# Patient Record
Sex: Male | Born: 2000 | Race: White | Hispanic: No | Marital: Single | Smoking: Never smoker
Health system: Southern US, Community
[De-identification: ages and names within clinical notes are randomized; demographics above are authoritative.]

## PROBLEM LIST (undated history)

## (undated) HISTORY — PX: OTHER SURGICAL HISTORY: SHX169

---
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
## 2018-04-06 DIAGNOSIS — Z23 Encounter for immunization: Secondary | ICD-10-CM | POA: Diagnosis not present

## 2018-04-30 DIAGNOSIS — M9903 Segmental and somatic dysfunction of lumbar region: Secondary | ICD-10-CM | POA: Diagnosis not present

## 2018-05-04 DIAGNOSIS — M9903 Segmental and somatic dysfunction of lumbar region: Secondary | ICD-10-CM | POA: Diagnosis not present

## 2018-05-13 DIAGNOSIS — M9903 Segmental and somatic dysfunction of lumbar region: Secondary | ICD-10-CM | POA: Diagnosis not present

## 2018-05-27 DIAGNOSIS — M9903 Segmental and somatic dysfunction of lumbar region: Secondary | ICD-10-CM | POA: Diagnosis not present

## 2018-06-11 DIAGNOSIS — Z68.41 Body mass index (BMI) pediatric, 5th percentile to less than 85th percentile for age: Secondary | ICD-10-CM | POA: Diagnosis not present

## 2018-06-11 DIAGNOSIS — Z713 Dietary counseling and surveillance: Secondary | ICD-10-CM | POA: Diagnosis not present

## 2018-06-11 DIAGNOSIS — Z0000 Encounter for general adult medical examination without abnormal findings: Secondary | ICD-10-CM | POA: Diagnosis not present

## 2018-06-11 DIAGNOSIS — Z23 Encounter for immunization: Secondary | ICD-10-CM | POA: Diagnosis not present

## 2018-06-11 DIAGNOSIS — Z7182 Exercise counseling: Secondary | ICD-10-CM | POA: Diagnosis not present

## 2018-06-30 DIAGNOSIS — H5213 Myopia, bilateral: Secondary | ICD-10-CM | POA: Diagnosis not present

## 2019-01-08 ENCOUNTER — Ambulatory Visit
Admission: RE | Admit: 2019-01-08 | Discharge: 2019-01-08 | Disposition: A | Discharge status: Home / Self Care | Payer: No Typology Code available for payment source | Source: Ambulatory Visit | Attending: Nurse Practitioner | Admitting: Nurse Practitioner

## 2019-01-08 ENCOUNTER — Other Ambulatory Visit: Payer: Self-pay

## 2019-01-08 ENCOUNTER — Other Ambulatory Visit: Payer: Self-pay | Admitting: Nurse Practitioner

## 2019-01-08 DIAGNOSIS — Z021 Encounter for pre-employment examination: Secondary | ICD-10-CM

## 2020-10-05 IMAGING — CR CHEST  1 VIEW
1 series · 1 of 1 positions shown · non-contrast
Comparison: None.

CLINICAL DATA: Pre-employment physical examination

EXAM:
CHEST  1 VIEW

[w chest pa]
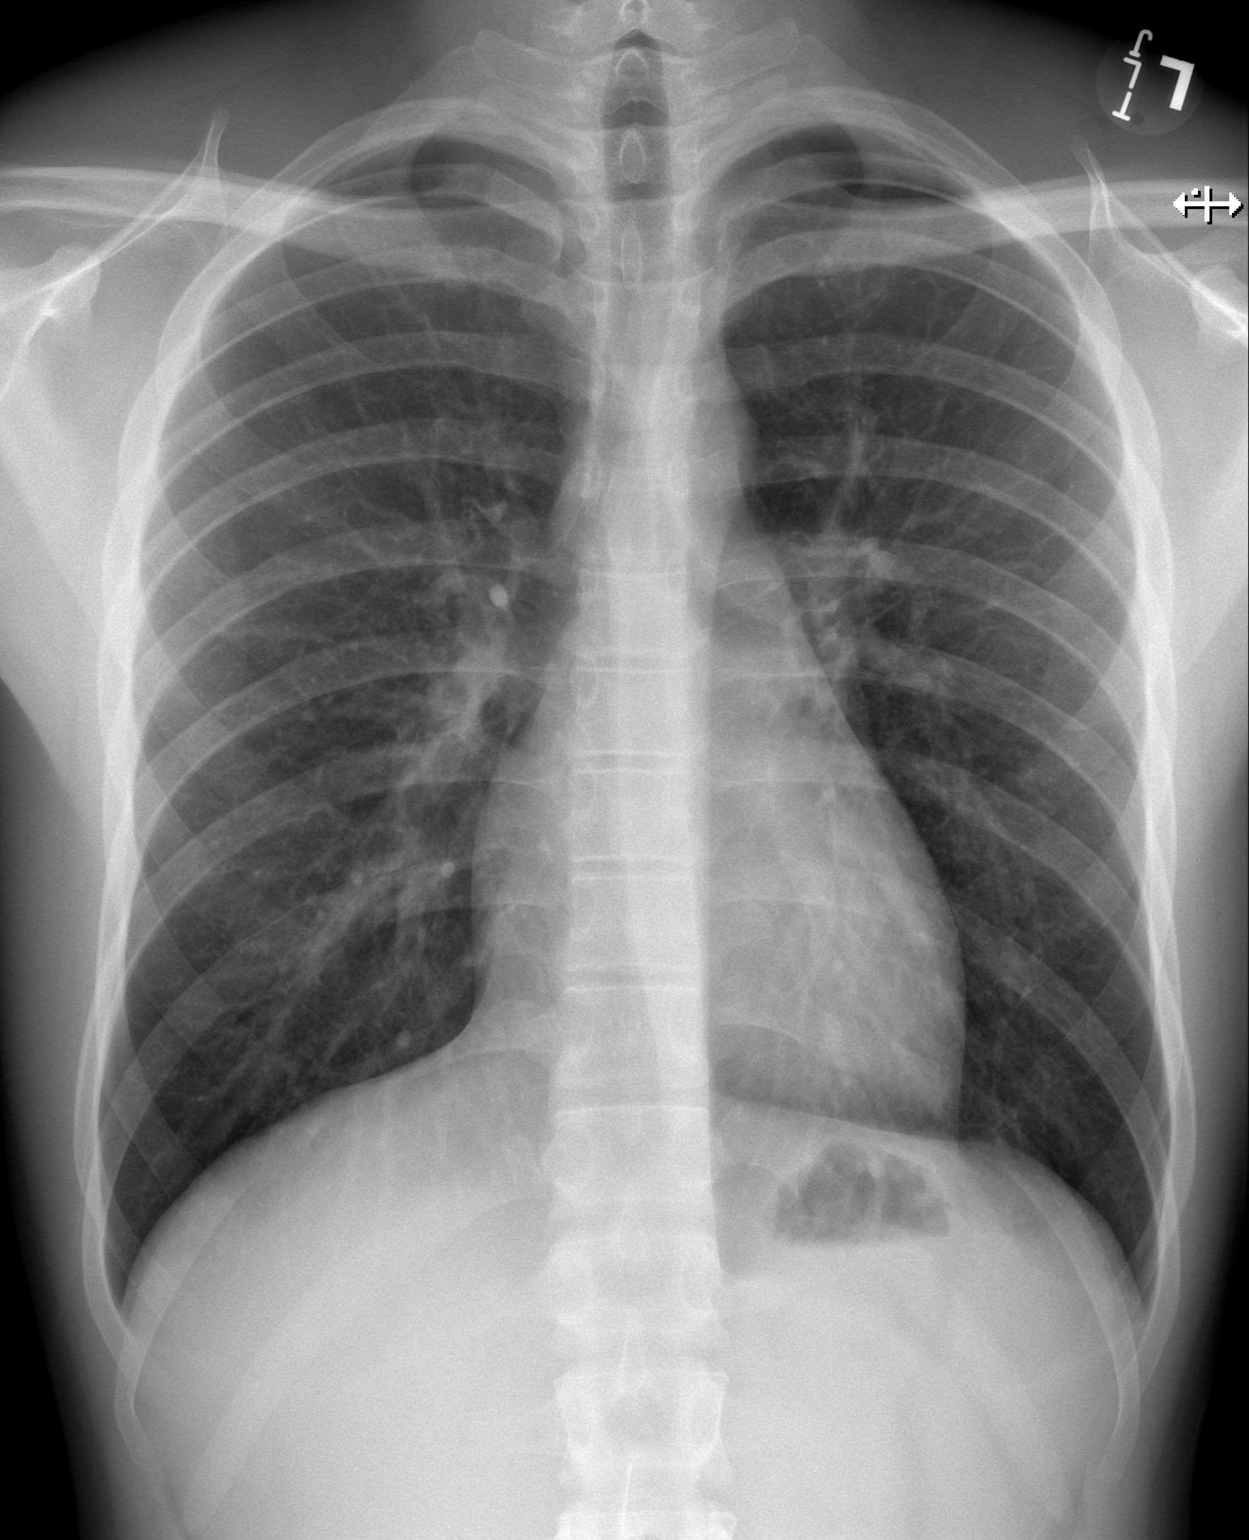

[1 of 1 positions shown; findings below may reference images not displayed]

FINDINGS: Lungs are clear. Heart size and pulmonary vascularity are normal. No
adenopathy. No bone lesions.
IMPRESSION: No abnormality noted.
